# Patient Record
Sex: Male | Born: 1975 | Race: White | Hispanic: No | Marital: Married | Smoking: Never smoker
Health system: Southern US, Community
[De-identification: ages and names within clinical notes are randomized; demographics above are authoritative.]

## PROBLEM LIST (undated history)

## (undated) DIAGNOSIS — M109 Gout, unspecified: Secondary | ICD-10-CM

---

## 2008-02-23 ENCOUNTER — Emergency Department (HOSPITAL_BASED_OUTPATIENT_CLINIC_OR_DEPARTMENT_OTHER): Admission: EM | Admit: 2008-02-23 | Discharge: 2008-02-23 | Payer: Self-pay | Admitting: Emergency Medicine

## 2009-07-19 IMAGING — CR DG WRIST COMPLETE 3+V*L*
4 series · 4 of 4 positions shown · non-contrast
Comparison: None

CLINICAL DATA: Fall with left wrist injury

LEFT WRIST - COMPLETE 3+ VIEW

[x wrist pa left]
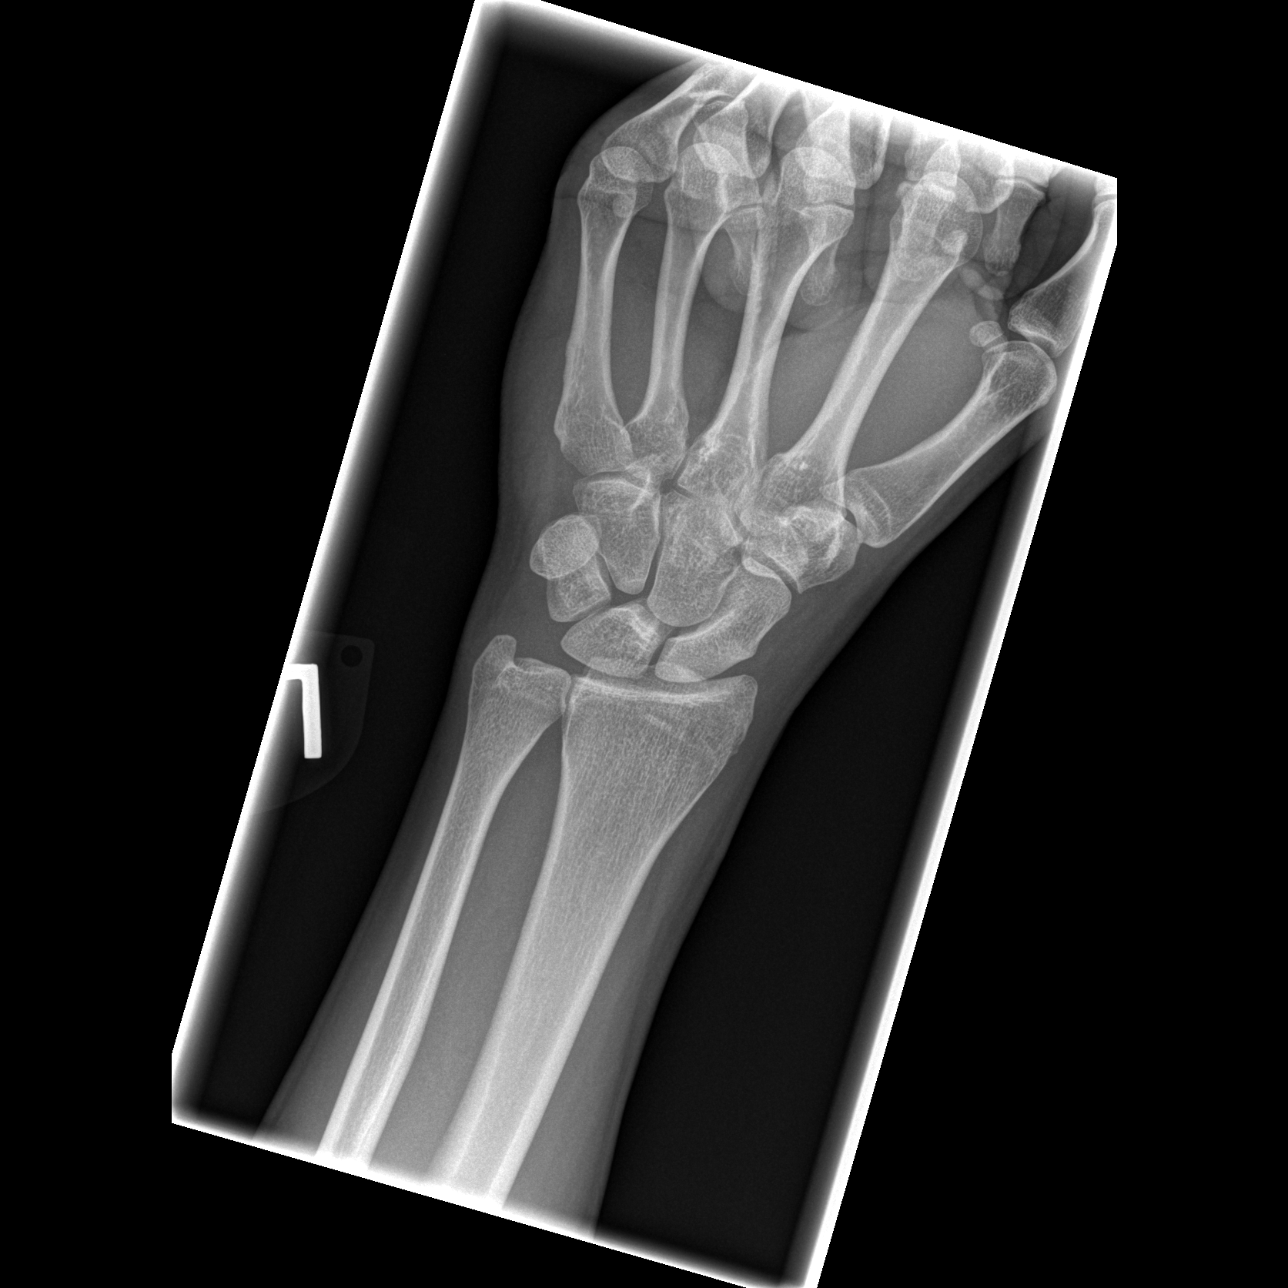

[x wrist obl left]
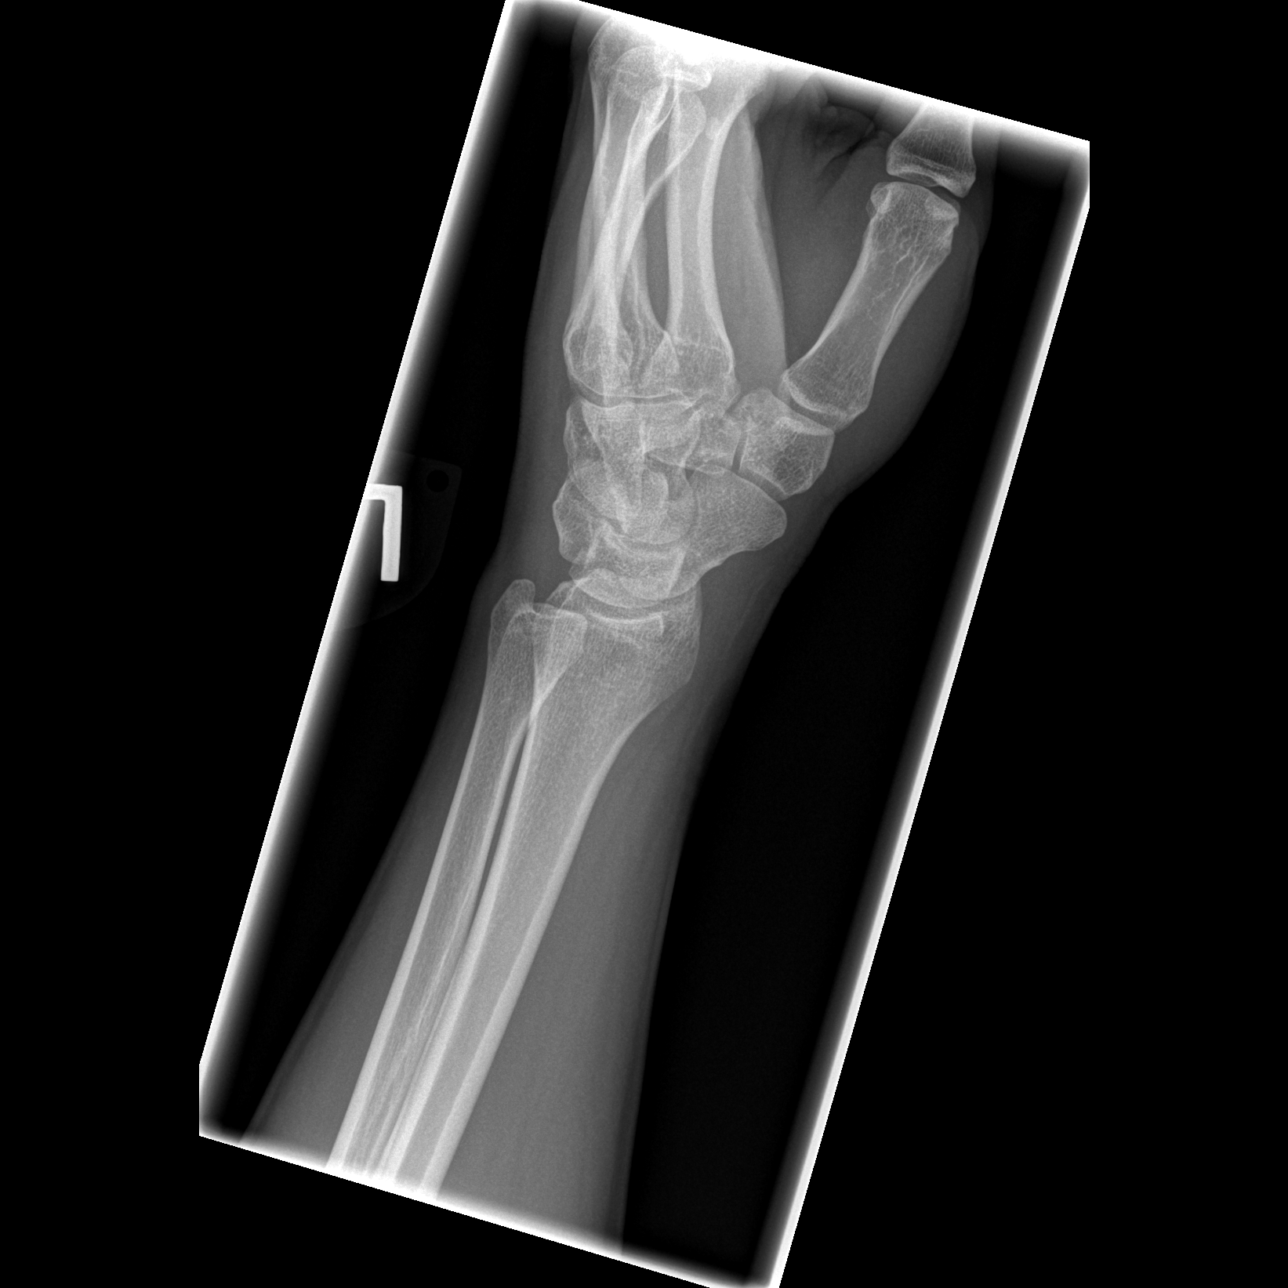

[x wrist lat left]
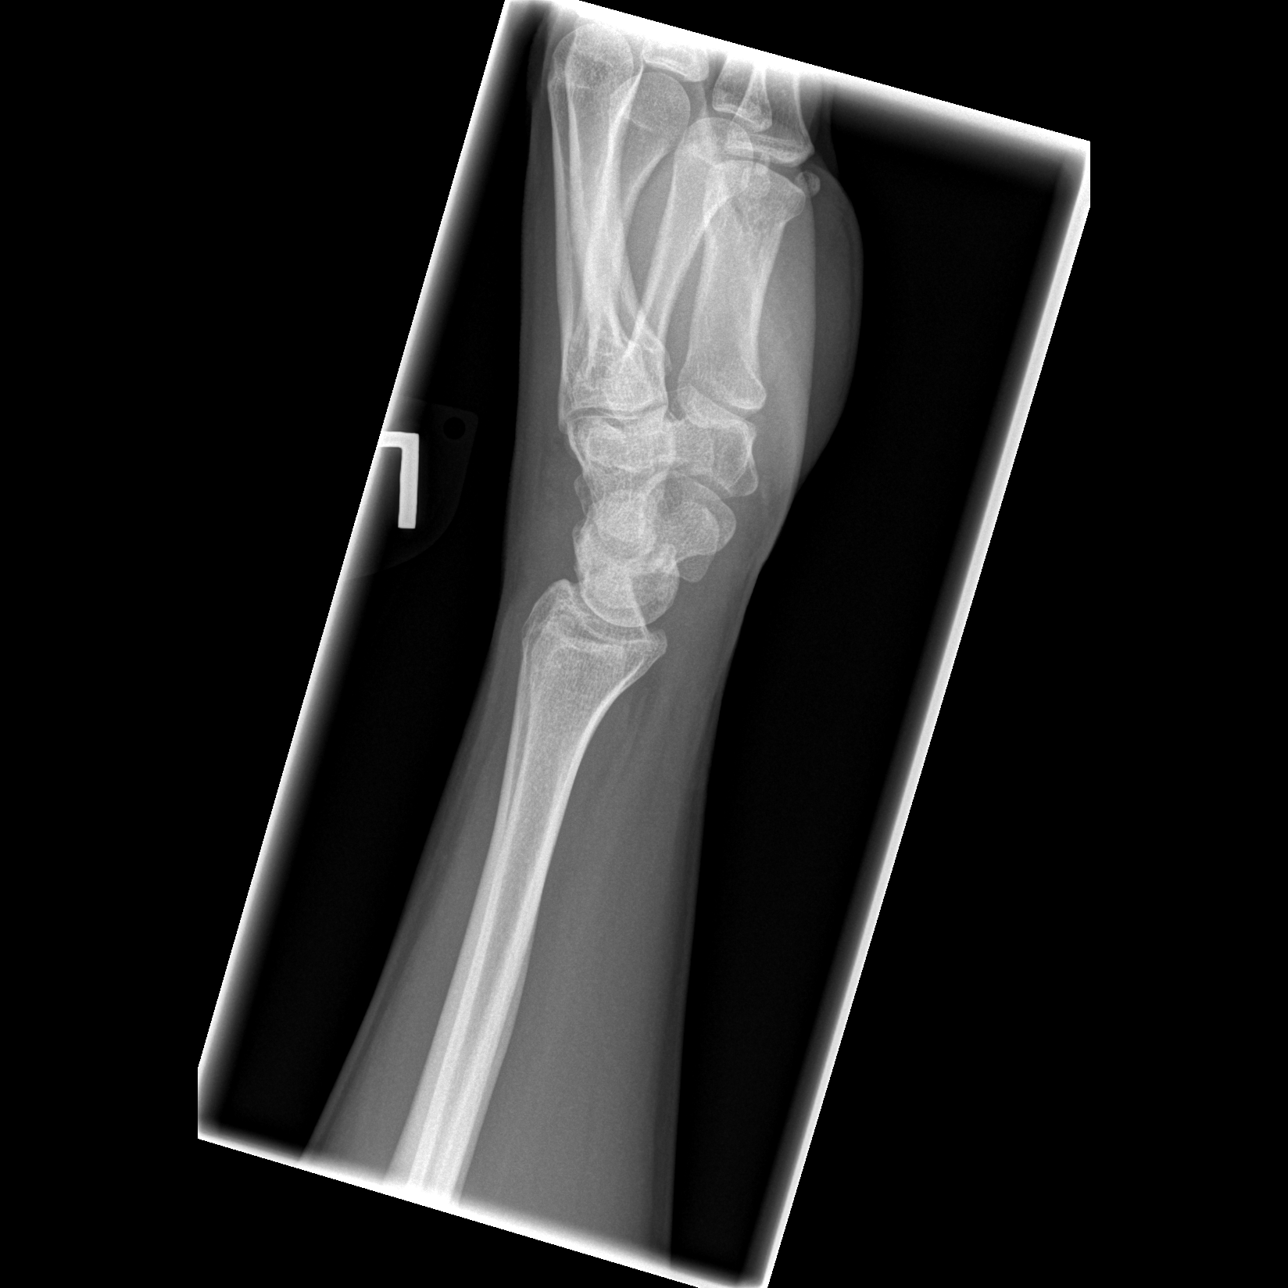

[x navicular]
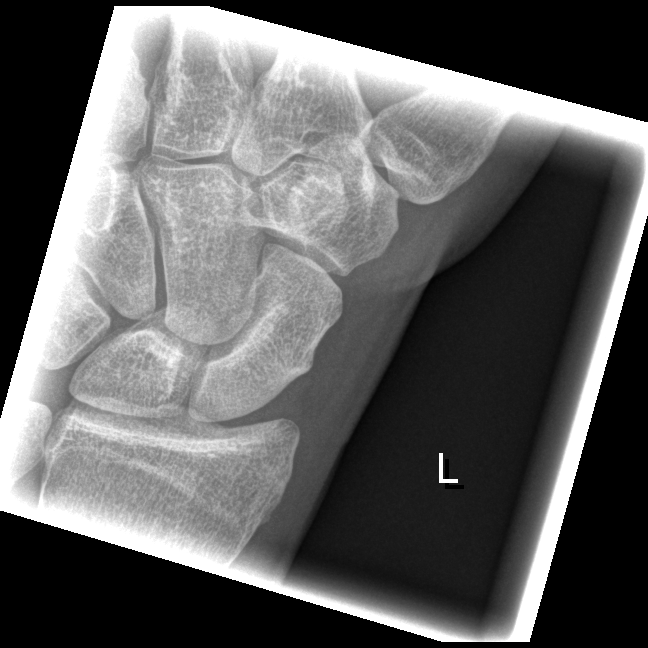

[4 of 4 positions shown; findings below may reference images not displayed]

FINDINGS: There is no evidence of fracture or dislocation.  There
is no evidence of arthropathy or other focal bone abnormality.
Soft tissues are unremarkable.
IMPRESSION: Negative.

## 2014-12-04 ENCOUNTER — Encounter (HOSPITAL_BASED_OUTPATIENT_CLINIC_OR_DEPARTMENT_OTHER): Payer: Self-pay | Admitting: Emergency Medicine

## 2014-12-04 ENCOUNTER — Emergency Department (HOSPITAL_BASED_OUTPATIENT_CLINIC_OR_DEPARTMENT_OTHER)
Admission: EM | Admit: 2014-12-04 | Discharge: 2014-12-04 | Disposition: A | Discharge status: Home / Self Care | Payer: 59 | Attending: Emergency Medicine | Admitting: Emergency Medicine

## 2014-12-04 DIAGNOSIS — M25432 Effusion, left wrist: Secondary | ICD-10-CM | POA: Diagnosis not present

## 2014-12-04 DIAGNOSIS — M25532 Pain in left wrist: Secondary | ICD-10-CM | POA: Diagnosis present

## 2014-12-04 DIAGNOSIS — M1000 Idiopathic gout, unspecified site: Secondary | ICD-10-CM | POA: Insufficient documentation

## 2014-12-04 HISTORY — DX: Gout, unspecified: M10.9

## 2014-12-04 MED ORDER — PREDNISONE 10 MG PO TABS: 20.0000 mg | ORAL_TABLET | Freq: Every day | ORAL | Status: AC

## 2014-12-04 MED ORDER — INDOMETHACIN 25 MG PO CAPS
25.0000 mg | ORAL_CAPSULE | Freq: Once | ORAL | Status: AC
  Administered 2014-12-04: 25 mg via ORAL
  Filled 2014-12-04: qty 1

## 2014-12-04 MED ORDER — PREDNISONE 10 MG PO TABS
60.0000 mg | ORAL_TABLET | Freq: Once | ORAL | Status: AC
  Administered 2014-12-04: 60 mg via ORAL
  Filled 2014-12-04 (×2): qty 1

## 2014-12-04 MED ORDER — OXYCODONE-ACETAMINOPHEN 5-325 MG PO TABS
2.0000 | ORAL_TABLET | Freq: Once | ORAL | Status: AC
  Administered 2014-12-04: 2 via ORAL
  Filled 2014-12-04: qty 2

## 2014-12-04 MED ORDER — INDOMETHACIN 25 MG PO CAPS: 25.0000 mg | ORAL_CAPSULE | Freq: Three times a day (TID) | ORAL | Status: AC

## 2014-12-04 MED ORDER — OXYCODONE-ACETAMINOPHEN 5-325 MG PO TABS
1.0000 | ORAL_TABLET | Freq: Four times a day (QID) | ORAL | Status: AC | PRN
Start: 2014-12-04 — End: ?

## 2014-12-04 NOTE — ED Notes (Signed)
Patient has pain and swelling to his left wrist since tues. 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

## 2014-12-04 NOTE — Discharge Instructions (Signed)
Gout °Gout is when your joints become red, sore, and swell (inflamed). This is caused by the buildup of uric acid crystals in the joints. Uric acid is a chemical that is normally in the blood. If the level of uric acid gets too high in the blood, these crystals form in your joints and tissues. Over time, these crystals can form into masses near the joints and tissues. These masses can destroy bone and cause the bone to look misshapen (deformed). °HOME CARE  °· Do not take aspirin for pain. °· Only take medicine as told by your doctor. °· Rest the joint as much as you can. When in bed, keep sheets and blankets off painful areas. °· Keep the sore joints raised (elevated). °· Put warm or cold packs on painful joints. Use of warm or cold packs depends on which works best for you. °· Use crutches if the painful joint is in your leg. °· Drink enough fluids to keep your pee (urine) clear or pale yellow. Limit alcohol, sugary drinks, and drinks with fructose in them. °· Follow your diet instructions. Pay careful attention to how much protein you eat. Include fruits, vegetables, whole grains, and fat-free or low-fat milk products in your daily diet. Talk to your doctor or dietitian about the use of coffee, vitamin C, and cherries. These may help lower uric acid levels. °· Keep a healthy body weight. °GET HELP RIGHT AWAY IF:  °· You have watery poop (diarrhea), throw up (vomit), or have any side effects from medicines. °· You do not feel better in 24 hours, or you are getting worse. °· Your joint becomes suddenly more tender, and you have chills or a fever. °MAKE SURE YOU:  °· Understand these instructions. °· Will watch your condition. °· Will get help right away if you are not doing well or get worse. °Document Released: 05/16/2008 Document Revised: 12/22/2013 Document Reviewed: 03/20/2012 °ExitCare® Patient Information ©2015 [employer]. This information is not intended to replace advice given to you by your health care  provider. Make sure you discuss any questions you have with your health care provider. ° °Emergency Department Resource Guide °1) Find a Doctor and Pay Out of Pocket °Although you won't have to find out who is covered by your insurance plan, it is a good idea to ask around and get recommendations. You will then need to call the office and see if the doctor you have chosen will accept you as a new patient and what types of options they offer for patients who are self-pay. Some doctors offer discounts or will set up payment plans for their patients who do not have insurance, but you will need to ask so you aren't surprised when you get to your appointment. ° °2) Contact Your Local Health Department °Not all health departments have doctors that can see patients for sick visits, but many do, so it is worth a call to see if yours does. If you don't know where your local health department is, you can check in your phone book. The CDC also has a tool to help you locate your state's health department, and many state websites also have listings of all of their local health departments. ° °3) Find a Walk-in Clinic °If your illness is not likely to be very severe or complicated, you may want to try a walk in clinic. These are popping up all over the country in pharmacies, drugstores, and shopping centers. They're usually staffed by nurse practitioners or physician assistants   that have been trained to treat common illnesses and complaints. They're usually fairly quick and inexpensive. However, if you have serious medical issues or chronic medical problems, these are probably not your best option. ° °No Primary Care Doctor: °- Call [facility] at  832-8000 - they can help you locate a primary care doctor that  accepts your insurance, provides certain services, etc. °- Physician Referral Service- 1-800-533-3463 ° °Chronic Pain Problems: °Organization         Address  Phone   Notes  ° [facility]  (336)  297-2271 Patients need to be referred by their primary care doctor.  ° °Medication Assistance: °Organization         Address  Phone   Notes  °[place] [facility] [address] °[place], [place] [address] (336) 641-8030 --Must be a resident of [place] °-- Must have NO insurance coverage whatsoever (no Medicaid/ Medicare, etc.) °-- The pt. MUST have a primary care doctor that directs their care regularly and follows them in the community °  °[facility]  (866) 331-1348   °[employer]  (888) 892-1162   ° °Agencies that provide inexpensive medical care: °Organization         Address  Phone   Notes  °[facility]  (336) 832-8035   °[facility]    (336) 832-7272   °[facility] [address] °[place], [place] [address] (336) 832-4777   °[facility] [address], °[place] (336) 271-4999   °[facility]    (336) 373-0678   °[facility]    (336) 272-1050   °[facility] ° [address], [place] Phone:  (336) 832-4444, Fax:  (336) 832-4440 Hours of Operation:  9 am - 6 pm, M-F.  Also accepts Medicaid/Medicare and self-pay.  °[facility] ° [address], [place] Phone: (336) 832-3150, Fax: (336) 832-3151. Hours of Operation:  8:30 am - 5:30 pm, M-F.  Also accepts Medicaid and self-pay.  °[facility] [place] [address], [place] Phone: (336) 878-6027   °[facility] [address], [place], [place] (336)723-1848, Ext. 123 Mondays & Thursdays: 7-9 AM.  First 15 patients are seen on a first come, first serve basis. °  ° °Medicaid-accepting [place] Providers: ° °Organization         Address  Phone   Notes  °[facility] [address], [place] (336) 641-2100 Also accepts self-pay patients.  °[facility] [address], [place] °  (336) 856-9996   °[facility] [address], [place] (336) 288-8857   °[facility] [address] [place] [address], [place] (336) 299-7000   °Veita Bland [address], [place]  ° (336) 373-1557 Only accepts [place] Access Medicaid patients after they have their name applied to their card.  ° °Self-Pay (no insurance) in [place]: ° °Organization         Address  Phone   Notes  °Sickle Cell Patients, [facility] [address], [place] (336) 832-1970   °[facility] [address], [place] (336) 832-4400   °[facility] ° [address], [place] (336) 992-4800   °[facility]/Dr. Osei-Bonsu ° [address] [place] [address],   [place] or [address], [place] (336) 841-8500 Phone number for both [place] and [place] locations is the same.  °[facility] [address], [place] (336) 299-0000   °[facility] [address] [place] [address], [place] or [address] (336) 852-7530 °(336) 878-2260   °[facility] [address], [place] (336) 350-1642, phone; (336) 294-5005, fax Sees patients 1st and 3rd Saturday of every month.  Must not qualify for public or private insurance (i.e. Medicaid, Medicare, [place] Health Choice, Veterans' Benefits) • Household income should be no more than 200% of the poverty level •The clinic cannot treat you if you are pregnant or think you are pregnant • Sexually transmitted diseases are not treated at the clinic.  ° ° °Dental Care: °Organization         Address  Phone  Notes  °[facility] [address], [place] (336) 641-6152 Accepts children up to age 21 who are enrolled in Medicaid or [place] Health Choice; pregnant women with a Medicaid card; and children who have applied for Medicaid or [place] Health Choice, but  were declined, whose parents can pay a reduced fee at time of service.  °[facility] [place]  [address], [place] (336) 641-7733 Accepts children up to age 21 who are enrolled in Medicaid or [place] Health Choice; pregnant women with a Medicaid card; and children who have applied for Medicaid or [place] Health Choice, but were declined, whose parents can pay a reduced fee at time of service.  °[facility] ° [address], [place] (336) 641-4533 Patients are seen by appointment only. Walk-ins are not accepted. [facility] will see patients 18 years of age and older. °Monday - Tuesday (8am-5pm) °Most Wednesdays (8:30-5pm) °$30 per visit, cash only  °[facility] ° [address], [place] (336) 641-4533 Patients are seen by appointment only. Walk-ins are not accepted. [facility] will see patients 18 years of age and older. °One Wednesday Evening (Monthly: Volunteer Based).  $30 per visit, cash only  °[facility]  (919) 537-3737 for adults; Children under age 4, call Graduate Pediatric Dentistry at (919) [phone]. Children aged 4-14, please call (919) [phone] to request a pediatric application. ° Dental services are provided in all areas of dental care including fillings, crowns and bridges, complete and partial dentures, implants, gum treatment, root canals, and extractions. Preventive care is also provided. Treatment is provided to both adults and children. °Patients are selected via a lottery and there is often a waiting list. °  °[facility] [address], °[place] ° (336) 763-8833 [email or web address] °  °[facility] [address], [place], [place] (336)723-1848, Ext. 123 Second and Fourth Thursday of each month, opens at 6:30 AM; Clinic ends at 9 AM.  Patients are seen on a first-come first-served basis, and a limited number are seen during each clinic.   ° °[facility] ° [address], [place], [place] (336) 723-7904   Eligibility Requirements °You must have lived in [place], [place], or [place] counties for at least the last three months. °  You cannot be eligible for state or federal sponsored healthcare insurance, including Veterans Administration, Medicaid, or Medicare. °  You generally cannot be eligible for healthcare insurance through your employer.  °  How to apply: °  Eligibility screenings are held every Tuesday and Wednesday afternoon from 1:00 pm until 4:00 pm. You do not need an appointment for the interview!  °[facility] [address], [place], [place] 336-631-2330   °[facility]  336-342-8273   °[facility]  336-703-3100   °[facility]  336-570-6415   ° °Behavioral Health Resources in the Community: °Intensive Outpatient Programs °Organization         Address  Phone  Notes  °[facility] [address], [place], [place] 336-878-6098   °[facility] [address], [place], [place] 336-832-9800   °[facility] [address], [place], [place] ° 336-882-2125   °[facility] [address],  °[place], [place] 1-800-853-5163 or 336-641-4981   °Substance Abuse Resources °Organization         Address  Phone  Notes  °[facility]  336-882-2125   °[facility]  336-784-9470   °[facility]  336-285-9073   °[facility]  336-845-3988   °Residential & Outpatient Substance Abuse Program  1-800-659-3381   °Psychological Services °Organization         Address  Phone  Notes  °[facility]  336- 832-9600   °[facility]  336- 378-7881   °[facility] [address], [place] 1-800-853-5163 or 336-641-4981   ° °Mobile Crisis Teams °Organization         Address  Phone  Notes  °[facility], Mobile Crisis Care  Unit  1-877-626-1772   °Assertive °[facility] ° [address] [place], [place] 336-834-9664   °Sharon DeEsch [address] °[place] 336-554-5454   ° °Self-Help/Support Groups °Organization         Address  Phone             Notes  °[facility] [place] - variety of support groups  336- 373-1402 Call for more information  °Narcotics Anonymous (NA), [facility] [address], °[place]  2 meetings at this location  ° °Residential Treatment Programs °Organization         Address  Phone  Notes  °[facility] [address],    °[place]  1-866-801-8205   °[facility] ° [address], [place], [place] 704-293-8524   °[facility] [address], [place] 336-845-3988 Admissions: 8am-3pm M-F  °[facility] [address]    °[place], [place] 336-841-1104   °[facility] [address], [place], [place] 336-379-7146   °[facility] [address]  °[place], [place] 336-285-9073   °[facility] Intensive Outpatient [address], [place], [place] 336-852-3033   °[facility] [address]  °[place], [place] 1-877-615-2722 or 336-784-9470   °[facility] [address] [place], [place] 336-227-7417 Accepts Medicaid  °[facility] [address]  °[place] 1-800-659-3381 Substance Abuse/Addiction Treatment  ° °[place] Behavioral Health Resources °Organization         Address  Phone  Notes  °[facility]  (888) 581-9988   °Julie Brannon, PhD [address] [place], [place]   (336) 349-5553 or (336) 951-0000   °[facility]   [address] °, [place] (336) 349-4454   °[facility] [address], [place], [place] (336)   342-8316 Insurance/Medicaid/sponsorship through [facility]  °[facility] [address]                                     [place], [place] (336) 342-8316 Therapy/tele-psych/case  °[facility] [address]  ° [place], [place] (336) 349-2233    °Dr. Arfeen  (336) 349-4544   °[facility] [place]  [employer] [place] [facility] 1) [address],  °2) [address], [place] °3)  [address], [place] (336) 349-3220 °(336) 342-7768 ° °(336) 342-8140   °[place] [facility] (336) 342-1394 or (336) 342-3537 (After Hours)    ° ° °

## 2014-12-04 NOTE — ED Provider Notes (Signed)
CSN: [id number]     Arrival date & time 12/04/14  [id number] History   First MD Initiated Contact with Patient 12/04/14 [phone]     Chief Complaint  Patient presents with  . Wrist Pain     (Consider location/radiation/quality/duration/timing/severity/associated sxs/prior Treatment) Patient is a 39 y.o. male presenting with wrist pain. The history is provided by the patient.  Wrist Pain This is a new problem. The current episode started more than 2 days ago. The problem occurs constantly. The problem has not changed since onset.Pertinent negatives include no chest pain, no abdominal pain, no headaches and no shortness of breath. Nothing aggravates the symptoms. Nothing relieves the symptoms. Treatments tried: ibuprofen. The treatment provided no relief.  History of gout in the foot per the patient but has never had in the wrist.  Ibuprofen usually works for the foot but has not been working for his wrist.    Past Medical History  Diagnosis Date  . Gout    History reviewed. No pertinent past surgical history. History reviewed. No pertinent family history. History  Substance Use Topics  . Smoking status: Never Smoker   . Smokeless tobacco: Not on file  . Alcohol Use: Yes     Comment: occ    Review of Systems  Respiratory: Negative for shortness of breath.   Cardiovascular: Negative for chest pain.  Gastrointestinal: Negative for abdominal pain.  Neurological: Negative for headaches.  All other systems reviewed and are negative.     Allergies  Review of patient's allergies indicates no known allergies.  Home Medications   Prior to Admission medications   Medication Sig Start Date End Date Taking? Authorizing Provider  indomethacin (INDOCIN) 25 MG capsule Take 1 capsule (25 mg total) by mouth 3 (three) times daily with meals. 12/04/14   Altonio Schwertner, MD  oxyCODONE-acetaminophen (PERCOCET) 5-325 MG per tablet Take 1 tablet by mouth every 6 (six) hours as needed. 12/04/14   Khadija Thier, MD  predniSONE (DELTASONE) 10 MG tablet Take 2 tablets (20 mg total) by mouth daily. 12/04/14   Mattew Chriswell, MD   BP 127/79 mmHg  Pulse 59  Temp(Src) 99 F (37.2 C) (Oral)  Resp 16  Ht 6' (1.829 m)  Wt 190 lb (86.183 kg)  BMI 25.76 kg/m2  SpO2 100% Physical Exam  Constitutional: He is oriented to person, place, and time. He appears well-developed and well-nourished. No distress.  HENT:  Head: Normocephalic and atraumatic.  Mouth/Throat: Oropharynx is clear and moist.  Eyes: Conjunctivae are normal. Pupils are equal, round, and reactive to light.  Neck: Normal range of motion. Neck supple.  Cardiovascular: Normal rate, regular rhythm and intact distal pulses.   Pulmonary/Chest: Effort normal and breath sounds normal. No respiratory distress. He has no wheezes. He has no rales.  Abdominal: Soft. Bowel sounds are normal. There is no tenderness. There is no rebound and no guarding.  Musculoskeletal: Normal range of motion.       Left wrist: He exhibits swelling. He exhibits normal range of motion, no bony tenderness, no effusion, no crepitus, no deformity and no laceration.  No snuff box tenderness, warmth without erythema or fluctuance.    Neurological: He is alert and oriented to person, place, and time.  Skin: Skin is warm and dry.  Psychiatric: He has a normal mood and affect.    ED Course  Procedures (including critical care time) Labs Review Labs Reviewed - No data to display  Imaging Review No results found.   EKG Interpretation  None      MDM   Final diagnoses:  Acute idiopathic gout, unspecified site    Will treat with indomethacin, steroids, percocet and low purine diet and close follow up.  No xrays indicated at this time.      Cy Blamer, MD 12/04/14 [phone]

## 2020-09-20 ENCOUNTER — Ambulatory Visit (INDEPENDENT_AMBULATORY_CARE_PROVIDER_SITE_OTHER): Payer: Managed Care, Other (non HMO) | Admitting: Medical

## 2020-09-20 VITALS — BP 133/103 | HR 79 | Temp 98.4°F | Resp 16 | Wt 208.0 lb

## 2020-09-20 DIAGNOSIS — J4520 Mild intermittent asthma, uncomplicated: Secondary | ICD-10-CM

## 2020-09-20 DIAGNOSIS — U071 COVID-19: Secondary | ICD-10-CM

## 2020-09-20 DIAGNOSIS — R059 Cough, unspecified: Secondary | ICD-10-CM

## 2020-09-20 MED ORDER — ALBUTEROL SULFATE HFA 108 (90 BASE) MCG/ACT IN AERS: 2.0000 | INHALATION_SPRAY | Freq: Four times a day (QID) | RESPIRATORY_TRACT | 0 refills | Status: AC | PRN

## 2020-09-20 MED ORDER — PROMETHAZINE-DM 6.25-15 MG/5ML PO SYRP: 5.0000 mL | ORAL_SOLUTION | Freq: Four times a day (QID) | ORAL | 0 refills | Status: AC | PRN

## 2020-09-20 NOTE — Progress Notes (Signed)
[facility]   Subjective:  DAVONN FLANERY is a 45 y.o. male who presents for respiratory illness.    PCP: Patient, No Pcp Per  He reports symptoms began 10 days ago.  He tested positive for COVID with a home test last night.  He notes cough, congestion, postnasal drainage, dry cough, feels stuffy in the lungs.  He had body aches, has felt a little wheezy, had some vomiting episodes of mucus in the morning a few times, loose stool 1-2 times, but overall similar symptoms have improved.  The main symptoms right now is cough and shortness of breath.  He feels little wheezy.  He is using Advil Cold and Sinus.  He is using vitamins including extra vitamin C and vitamin D and zinc.    He had asthma growing up in does get flareups with respiratory infections.  He thinks he could benefit from inhaler  He has not had the COVID vaccine  Although his blood pressure was elevated today, he feels like it is related to a stressful day today.  He says his blood pressure is usually normal.  It was normal at his dentist a few weeks ago  Past history is significant for no significant health issues. Patient is not a smoker. No other aggravating or relieving factors.  No other c/o.  Past Medical History:  Diagnosis Date  . Gout     ROS as in subjective   Objective: BP (!) 133/103 (BP Location: Left Arm, Cuff Size: Normal)   Pulse 79   Temp 98.4 F (36.9 C) (Oral)   Resp 16   Wt 208 lb (94.3 kg)   SpO2 99%   BMI 28.21 kg/m   General appearance: Alert, WD/WN, no distress, mildly ill appearing                             Skin: warm, no rash                           Head: no sinus tenderness                            Eyes: conjunctiva normal, corneas clear, PERRLA                            Ears: pearly TMs, external ear canals normal                          Nose: septum midline, turbinates without erythema and no discharge             Mouth/throat: somewhat dry MM, tongue  normal, no pharyngeal erythema                           Neck: supple, no adenopathy, no thyromegaly, non tender                          Heart: RRR, normal S1, S2, no murmurs                         Lungs: decreased breath sounds, no wheezes, rales, or rhonchi, no egophony  Ext: no calve asymetry, no lower extremity swelling, negative homans       Assessment  Encounter Diagnoses  Name Primary?  . COVID-19 virus infection Yes  . Cough   . Mild intermittent asthma, unspecified whether complicated       Plan: Overall he has no major underlying health issues other than history of asthma.  He seems to be improving.  He seems to have more of an asthma flareup at the moment but most of his illness symptoms have improved  Begin Promethazine DM for cough, albuterol to help with wheezing, shortness of breath and cough spells.  Discussed risk and benefits of medication, proper use of medication.  Offered chest x-ray and some baseline labs but he declines  General recommendations if you have respiratory symptoms: We recommend you rest, hydrate well with water and clear fluids throughout the day such as water, soup broth, ice chips, or possibly pedialyte or G2 no sugar gatorade.   You can use Tylenol over the counter for pain or fever every 4 - 6 hours You can use over the counter Delsym or mucinex DM for cough unless your provider prescribed a cough medication already. You can use over the counter Emetrol over the counter for nausea.    Consider EmergenC Immune plus vitamin pack over the counter which contains extra vitamin C, vitamin D, and zinc.  If you are having trouble breathing, if you are very weak, have high fever 103 or higher consistently despite Tylenol, or uncontrollable nausea and vomiting, then call or go to the emergency department.    Covid symptoms such as fatigue and cough can linger over 2 weeks, even after the initial fever, aches, chills, and other  initial symptoms.  We discussed quarantine and isolation recommendations  Advised if not significantly improved over the next few days to either go for chest x-ray or follow-up at the daytime post Covid clinic here in this office  Sedric was seen today for covid positive.  Diagnoses and all orders for this visit:  COVID-19 virus infection -     DG Chest 2 View; Future  Cough -     DG Chest 2 View; Future  Mild intermittent asthma, unspecified whether complicated -     DG Chest 2 View; Future  Other orders -     promethazine-dextromethorphan (PROMETHAZINE-DM) 6.25-15 MG/5ML syrup; Take 5 mLs by mouth 4 (four) times daily as needed for cough. -     albuterol (VENTOLIN HFA) 108 (90 Base) MCG/ACT inhaler; Inhale 2 puffs into the lungs every 6 (six) hours as needed for wheezing or shortness of breath.     Patient voiced understanding of diagnosis, recommendations, and treatment plan.  After visit summary given.   F/u prn

## 2020-09-20 NOTE — Addendum Note (Signed)
Addended by: Kiahna Banghart S on: 09/20/2020 10:38 PM   Modules accepted: Level of Service
